# Patient Record
Sex: Female | Born: 1995 | Race: White | Hispanic: No | Marital: Single | Smoking: Never smoker
Health system: Southern US, Community
[De-identification: ages and names within clinical notes are randomized; demographics above are authoritative.]

## PROBLEM LIST (undated history)

## (undated) DIAGNOSIS — J302 Other seasonal allergic rhinitis: Secondary | ICD-10-CM

## (undated) DIAGNOSIS — Z9289 Personal history of other medical treatment: Secondary | ICD-10-CM

## (undated) DIAGNOSIS — J40 Bronchitis, not specified as acute or chronic: Secondary | ICD-10-CM

## (undated) DIAGNOSIS — J3089 Other allergic rhinitis: Secondary | ICD-10-CM

## (undated) DIAGNOSIS — J45990 Exercise induced bronchospasm: Secondary | ICD-10-CM

## (undated) HISTORY — DX: Bronchitis, not specified as acute or chronic: J40

## (undated) HISTORY — DX: Other seasonal allergic rhinitis: J30.2

## (undated) HISTORY — PX: WISDOM TOOTH EXTRACTION: SHX21

## (undated) HISTORY — DX: Other allergic rhinitis: J30.89

## (undated) HISTORY — DX: Exercise induced bronchospasm: J45.990

## (undated) HISTORY — DX: Personal history of other medical treatment: Z92.89

---
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
## 2016-11-20 ENCOUNTER — Telehealth: Payer: Self-pay

## 2016-11-20 NOTE — Telephone Encounter (Signed)
SENT NOTES TO SCHEDULING 

## 2016-11-21 ENCOUNTER — Encounter (INDEPENDENT_AMBULATORY_CARE_PROVIDER_SITE_OTHER): Payer: Self-pay

## 2016-11-21 ENCOUNTER — Ambulatory Visit (INDEPENDENT_AMBULATORY_CARE_PROVIDER_SITE_OTHER): Payer: BLUE CROSS/BLUE SHIELD | Admitting: Physician Assistant

## 2016-11-21 VITALS — BP 110/60 | HR 66 | Ht 62.0 in | Wt 127.8 lb

## 2016-11-21 DIAGNOSIS — R9431 Abnormal electrocardiogram [ECG] [EKG]: Secondary | ICD-10-CM | POA: Diagnosis not present

## 2016-11-21 DIAGNOSIS — R079 Chest pain, unspecified: Secondary | ICD-10-CM

## 2016-11-21 NOTE — Patient Instructions (Signed)
Medication Instructions:  Your physician recommends that you continue on your current medications as directed. Please refer to the Current Medication list given to you today.   Labwork: NONE ORDERED  Testing/Procedures: Your physician has requested that you have an exercise tolerance test. For further information please visit [email or web address]. Please also follow instruction sheet, as given.    Follow-Up: AS NEEDED AT THIS TIME  Any Other Special Instructions Will Be Listed Below (If Applicable).     If you need a refill on your cardiac medications before your next appointment, please call your pharmacy.

## 2016-11-21 NOTE — Progress Notes (Signed)
Cardiology Office Note:    Date:  11/21/2016   ID:  Tiffany Bauer, DOB 10/18/1995, MRN [id number]  PCP:  Shaune PollackGates, Donna, MD  Cardiologist:  New - Dr. Verdis PrimeHenry Smith   Referring MD: Shirlean MylarWebb, Carol, MD   Chief Complaint  Patient presents with  . Chest Pain  . Abnormal ECG    History of Present Illness:    Tiffany Bauer is a 21 y.o. female with a hx of exercise induced asthma who is being seen today for the evaluation of chest pain and abnormal ECG at the request of Shirlean MylarWebb, Carol, MD.   Tiffany Bauer is here today with her father.  She has had L sided chest pain for about 9 months. It would occur intermittently.  When it did occur, she noted worse symptoms with a deep breath. She is quite active and exercises 3-4 times a week.  She denies exertional chest pain or significant dyspnea on exertion. She does get lightheaded sometimes when she stands quickly.  She has been lightheaded during exercise in the past.  She denies rapid palpitations.  She denies a hx of syncope.  She denies orthopnea, edema.  She was placed on Meloxicam and her chest pain is much better.  She denies any further pleuritic symptoms.    PAD Screen 11/21/2016  Previous PAD dx? No  Previous surgical procedure? No  Pain with walking? No  Feet/toe relief with dangling? No  Painful, non-healing ulcers? No  Extremities discolored? No    Prior CV studies:   The following studies were reviewed today:  None - ECG from PCP has been requested for review  Past Medical History:  Diagnosis Date  . Exercise-induced asthma 06/23/2012   Methacholine Challenge 08/09/12- normal, negative for hyperreactive airways  CPST/ Cardiopulmonary stress test- normal, noted the she had maximized respiratory muscle effort. Interpreting physician provided an article evaluating similar complaints in adolescent women by CPST. This article suggested that symptoms may reflect changing muscle power and skeletal mechanics in this age/gender demographic  .  Frequent episodes of bronchitis   . Seasonal and perennial allergic rhinitis 06/23/2012    Past Surgical History:  Procedure Laterality Date  . WISDOM TOOTH EXTRACTION      Current Medications: Current Meds  Medication Sig  . albuterol (PROVENTIL HFA) 108 (90 BASE) MCG/ACT inhaler Inhale 2 puffs into the lungs 4 (four) times daily as needed for wheezing.  . Cetirizine-Pseudoephedrine (ZYRTEC-D PO) Take 120 mg by mouth as needed (ALLERGIES).   . fluticasone (FLONASE) 50 MCG/ACT nasal spray Place into both nostrils daily as needed for allergies or rhinitis.  Marland Kitchen. ibuprofen (ADVIL,MOTRIN) 200 MG tablet Take 200 mg by mouth as needed for headache or moderate pain.   . meloxicam (MOBIC) 15 MG tablet Take 15 mg by mouth daily.   . norgestimate-ethinyl estradiol (SPRINTEC 28) 0.25-35 MG-MCG tablet Take 1 tablet by mouth daily.     Allergies:   Patient has no known allergies.   Social History   Social History  . Marital status: Single    Spouse name: N/A  . Number of children: 0  . Years of education: N/A   Occupational History  . student    Social History Main Topics  . Smoking status: Never Smoker  . Smokeless tobacco: Never Used  . Alcohol use No  . Drug use: No  . Sexual activity: Not Asked   Other Topics Concern  . None   Social History Scientist, research (medical)arrative   Student at [employer]   Wants  to be a PA; majoring in [place]     Family Hx: The patient's family history includes Allergies in her mother; Heart attack in her maternal uncle; Heart attack (age of onset: 61) in her mother; Heart disease in her maternal grandfather; Rheum arthritis in her maternal grandfather. There is no history of Sudden Cardiac Death.  ROS:   Please see the history of present illness.    Review of Systems  Eyes: Positive for visual disturbance.  Cardiovascular: Positive for chest pain.  Respiratory: Positive for shortness of breath.   Hematologic/Lymphatic: Negative for bleeding problem.    Gastrointestinal: Negative for diarrhea and vomiting.  Neurological: Positive for dizziness.   All other systems reviewed and are negative.   EKGs/Labs/Other Test Reviewed:    EKG:  EKG is  ordered today.  The ekg ordered today demonstrates NSR, HR 67, normal axis, QTC 420 ms, PR 112 ms  Recent Labs: No results found for requested labs within last 8760 hours.   Recent Lipid Panel No results found for: CHOL, TRIG, HDL, CHOLHDL, LDLCALC, LDLDIRECT  Physical Exam:    VS:  BP 110/60 (BP Location: Right Arm, Patient Position: Sitting, Cuff Size: Normal)   Pulse 66   Ht 5\' 2"  (1.575 m)   Wt 127 lb 12.8 oz (58 kg)   SpO2 98%   BMI 23.37 kg/m     Wt Readings from Last 3 Encounters:  11/21/16 127 lb 12.8 oz (58 kg)  08/10/12 120 lb 6.4 oz (54.6 kg) (54 %, Z= 0.11)*  06/23/12 120 lb 3.2 oz (54.5 kg) (55 %, Z= 0.12)*   * Growth percentiles are based on CDC 2-20 Years data.     Physical Exam  Constitutional: She is oriented to person, place, and time. She appears well-developed and well-nourished. No distress.  HENT:  Head: Normocephalic and atraumatic.  Eyes: No scleral icterus.  Neck: Normal range of motion. No JVD present. Carotid bruit is not present.  Cardiovascular: Normal rate, regular rhythm, S1 normal, S2 normal and normal heart sounds.   No murmur heard. Pulmonary/Chest: Breath sounds normal. She has no wheezes. She has no rhonchi. She has no rales.  Abdominal: Soft. There is no tenderness.  Musculoskeletal: She exhibits no edema.  Neurological: She is alert and oriented to person, place, and time.  Skin: Skin is warm and dry.  Psychiatric: She has a normal mood and affect.    ASSESSMENT:    1. Chest pain, unspecified type   2. Abnormal EKG    PLAN:    In order of problems listed above:  1. Chest pain, unspecified type - Her symptoms are atypical for ischemia.  Her ECG is normal.  She has no cardiac risk factors except for strong FHx. She is improved on  NSAIDs and I suspect her chest pain was MSK only.  See discussion below.  As noted, will get an ETT mainly to rule out exercise induced arrhythmias.  Given high incidence of false positive ETT in women, we would not pursue perfusion imaging for borderline changes only.   2. Abnormal EKG -  Her PR interval is not significantly shortened.  No evidence of delta waves or pre-excitation.  She has no hx of syncope or rapid palpitations.  She has occasional lightheadedness and had noted lightheadedness with exercise at times.  She may have an element of POTS. We discussed adequate hydration.  I reviewed her ECG with Dr. Verdis Prime who agreed that her PR interval is not significantly shortened.  We will request the ECG from her PCP.  We will obtain an graded exercise treadmill test to rule out exercised induced arrhythmias.  If ETT normal, she can follow up with Cardiology prn.   Dispo:  Return As needed depending on test results with Dr. Katrinka Blazing or Tereso Newcomer, PA-C .   Medication Adjustments/Labs and Tests Ordered: Current medicines are reviewed at length with the patient today.  Concerns regarding medicines are outlined above.  Orders/Tests:  Orders Placed This Encounter  Procedures  . Exercise Tolerance Test  . EKG 12-Lead   Medication changes: No orders of the defined types were placed in this encounter.  Signed, Tereso Newcomer, PA-C  11/21/2016 10:57 AM    [facility] [address] [place], [place], [place]  [address] Phone: [phone]; Fax: [phone]

## 2016-12-10 ENCOUNTER — Ambulatory Visit (INDEPENDENT_AMBULATORY_CARE_PROVIDER_SITE_OTHER): Payer: BLUE CROSS/BLUE SHIELD

## 2016-12-10 DIAGNOSIS — R079 Chest pain, unspecified: Secondary | ICD-10-CM | POA: Diagnosis not present

## 2016-12-10 DIAGNOSIS — R9431 Abnormal electrocardiogram [ECG] [EKG]: Secondary | ICD-10-CM

## 2016-12-10 LAB — EXERCISE TOLERANCE TEST
CHL CUP RESTING HR STRESS: 76 {beats}/min
CHL RATE OF PERCEIVED EXERTION: 17
CSEPEDS: 0 s
Estimated workload: 13.7 METS
Exercise duration (min): 12 min
MPHR: 200 {beats}/min
Peak HR: 181 {beats}/min
Percent HR: 90 %

## 2016-12-11 ENCOUNTER — Encounter: Payer: Self-pay | Admitting: Physician Assistant

## 2016-12-17 ENCOUNTER — Ambulatory Visit: Payer: Self-pay | Admitting: Physician Assistant

## 2017-07-25 DIAGNOSIS — J209 Acute bronchitis, unspecified: Secondary | ICD-10-CM | POA: Diagnosis not present

## 2018-04-16 DIAGNOSIS — H9209 Otalgia, unspecified ear: Secondary | ICD-10-CM | POA: Diagnosis not present

## 2019-01-13 DIAGNOSIS — B360 Pityriasis versicolor: Secondary | ICD-10-CM | POA: Diagnosis not present

## 2019-01-15 DIAGNOSIS — Z20828 Contact with and (suspected) exposure to other viral communicable diseases: Secondary | ICD-10-CM | POA: Diagnosis not present

## 2019-01-15 DIAGNOSIS — Z6822 Body mass index (BMI) 22.0-22.9, adult: Secondary | ICD-10-CM | POA: Diagnosis not present

## 2019-03-14 DIAGNOSIS — Z01419 Encounter for gynecological examination (general) (routine) without abnormal findings: Secondary | ICD-10-CM | POA: Diagnosis not present

## 2019-03-14 DIAGNOSIS — Z6822 Body mass index (BMI) 22.0-22.9, adult: Secondary | ICD-10-CM | POA: Diagnosis not present

## 2019-03-14 DIAGNOSIS — Z118 Encounter for screening for other infectious and parasitic diseases: Secondary | ICD-10-CM | POA: Diagnosis not present

## 2019-03-14 DIAGNOSIS — R5383 Other fatigue: Secondary | ICD-10-CM | POA: Diagnosis not present

## 2019-03-14 DIAGNOSIS — Z23 Encounter for immunization: Secondary | ICD-10-CM | POA: Diagnosis not present

## 2019-03-14 DIAGNOSIS — N898 Other specified noninflammatory disorders of vagina: Secondary | ICD-10-CM | POA: Diagnosis not present

## 2020-02-23 DIAGNOSIS — F419 Anxiety disorder, unspecified: Secondary | ICD-10-CM | POA: Diagnosis not present
